# Patient Record
Sex: Female | Born: 1990 | Race: White | Hispanic: Yes | Marital: Single | Smoking: Never smoker
Health system: Southern US, Community
[De-identification: ages and names within clinical notes are randomized; demographics above are authoritative.]

## PROBLEM LIST (undated history)

## (undated) DIAGNOSIS — I10 Essential (primary) hypertension: Secondary | ICD-10-CM

---
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
## 2008-05-09 ENCOUNTER — Ambulatory Visit: Payer: Self-pay | Admitting: Certified Nurse Midwife

## 2009-05-04 ENCOUNTER — Emergency Department: Payer: Self-pay | Admitting: Emergency Medicine

## 2010-07-13 ENCOUNTER — Emergency Department: Payer: Self-pay | Admitting: Unknown Physician Specialty

## 2014-02-05 ENCOUNTER — Emergency Department: Payer: Self-pay | Admitting: Emergency Medicine

## 2014-02-05 LAB — COMPREHENSIVE METABOLIC PANEL
ALBUMIN: 3.5 g/dL (ref 3.4–5.0)
ALK PHOS: 100 U/L
ANION GAP: 6 — AB (ref 7–16)
BUN: 11 mg/dL (ref 7–18)
Bilirubin,Total: 0.3 mg/dL (ref 0.2–1.0)
CHLORIDE: 110 mmol/L — AB (ref 98–107)
Calcium, Total: 8.9 mg/dL (ref 8.5–10.1)
Co2: 22 mmol/L (ref 21–32)
Creatinine: 0.56 mg/dL — ABNORMAL LOW (ref 0.60–1.30)
EGFR (African American): >60
Glucose: 93 mg/dL (ref 65–99)
OSMOLALITY: 275 (ref 275–301)
Potassium: 4.1 mmol/L (ref 3.5–5.1)
SGOT(AST): 23 U/L (ref 15–37)
SGPT (ALT): 30 U/L (ref 12–78)
Sodium: 138 mmol/L (ref 136–145)
Total Protein: 8.1 g/dL (ref 6.4–8.2)

## 2014-02-05 LAB — URINALYSIS, COMPLETE
Bacteria: NONE SEEN
Bilirubin,UR: NEGATIVE
Glucose,UR: NEGATIVE mg/dL (ref 0–75)
Ketone: NEGATIVE
Leukocyte Esterase: NEGATIVE
NITRITE: NEGATIVE
PH: 6 (ref 4.5–8.0)
Protein: NEGATIVE
RBC,UR: 151 /HPF (ref 0–5)
Specific Gravity: 1.018 (ref 1.003–1.030)

## 2014-02-05 LAB — CBC
HCT: 44.9 % (ref 35.0–47.0)
HGB: 14.7 g/dL (ref 12.0–16.0)
MCH: 27.3 pg (ref 26.0–34.0)
MCHC: 32.6 g/dL (ref 32.0–36.0)
MCV: 84 fL (ref 80–100)
PLATELETS: 265 10*3/uL (ref 150–440)
RBC: 5.37 10*6/uL — ABNORMAL HIGH (ref 3.80–5.20)
RDW: 14.7 % — ABNORMAL HIGH (ref 11.5–14.5)
WBC: 10.4 10*3/uL (ref 3.6–11.0)

## 2015-01-22 ENCOUNTER — Emergency Department: Payer: BLUE CROSS/BLUE SHIELD

## 2015-01-22 ENCOUNTER — Encounter: Payer: Self-pay | Admitting: *Deleted

## 2015-01-22 ENCOUNTER — Other Ambulatory Visit: Payer: Self-pay

## 2015-01-22 ENCOUNTER — Emergency Department
Admission: EM | Admit: 2015-01-22 | Discharge: 2015-01-22 | Disposition: A | Discharge status: Home / Self Care | Payer: BLUE CROSS/BLUE SHIELD | Attending: Emergency Medicine | Admitting: Emergency Medicine

## 2015-01-22 DIAGNOSIS — K8050 Calculus of bile duct without cholangitis or cholecystitis without obstruction: Secondary | ICD-10-CM | POA: Insufficient documentation

## 2015-01-22 DIAGNOSIS — R1011 Right upper quadrant pain: Secondary | ICD-10-CM

## 2015-01-22 DIAGNOSIS — Z3202 Encounter for pregnancy test, result negative: Secondary | ICD-10-CM | POA: Insufficient documentation

## 2015-01-22 LAB — COMPREHENSIVE METABOLIC PANEL
ALT: 16 U/L (ref 14–54)
ANION GAP: 8 (ref 5–15)
AST: 18 U/L (ref 15–41)
Albumin: 4 g/dL (ref 3.5–5.0)
Alkaline Phosphatase: 86 U/L (ref 38–126)
BUN: 15 mg/dL (ref 6–20)
CALCIUM: 9.2 mg/dL (ref 8.9–10.3)
CO2: 27 mmol/L (ref 22–32)
CREATININE: 0.78 mg/dL (ref 0.44–1.00)
Chloride: 103 mmol/L (ref 101–111)
Glucose, Bld: 99 mg/dL (ref 65–99)
Potassium: 3.7 mmol/L (ref 3.5–5.1)
Sodium: 138 mmol/L (ref 135–145)
Total Bilirubin: 0.3 mg/dL (ref 0.3–1.2)
Total Protein: 7.8 g/dL (ref 6.5–8.1)

## 2015-01-22 LAB — CBC WITH DIFFERENTIAL/PLATELET
BASOS ABS: 0 10*3/uL (ref 0–0.1)
BASOS PCT: 0 %
EOS ABS: 0.4 10*3/uL (ref 0–0.7)
Eosinophils Relative: 4 %
HEMATOCRIT: 41.9 % (ref 35.0–47.0)
HEMOGLOBIN: 13.9 g/dL (ref 12.0–16.0)
LYMPHS PCT: 37 %
Lymphs Abs: 3.9 10*3/uL — ABNORMAL HIGH (ref 1.0–3.6)
MCH: 28.6 pg (ref 26.0–34.0)
MCHC: 33.2 g/dL (ref 32.0–36.0)
MCV: 86.3 fL (ref 80.0–100.0)
MONOS PCT: 6 %
Monocytes Absolute: 0.7 10*3/uL (ref 0.2–0.9)
NEUTROS ABS: 5.6 10*3/uL (ref 1.4–6.5)
Neutrophils Relative %: 53 %
Platelets: 236 10*3/uL (ref 150–440)
RBC: 4.86 MIL/uL (ref 3.80–5.20)
RDW: 13.5 % (ref 11.5–14.5)
WBC: 10.6 10*3/uL (ref 3.6–11.0)

## 2015-01-22 LAB — URINALYSIS COMPLETE WITH MICROSCOPIC (ARMC ONLY)
Bilirubin Urine: NEGATIVE
Glucose, UA: NEGATIVE mg/dL
Hgb urine dipstick: NEGATIVE
Ketones, ur: NEGATIVE mg/dL
Leukocytes, UA: NEGATIVE
Nitrite: NEGATIVE
PROTEIN: NEGATIVE mg/dL
Specific Gravity, Urine: 1.021 (ref 1.005–1.030)
pH: 7 (ref 5.0–8.0)

## 2015-01-22 LAB — LIPASE, BLOOD: LIPASE: 35 U/L (ref 22–51)

## 2015-01-22 LAB — HCG, QUANTITATIVE, PREGNANCY: HCG, BETA CHAIN, QUANT, S: 0 m[IU]/mL (ref <5)

## 2015-01-22 MED ORDER — ONDANSETRON 4 MG PO TBDP
4.0000 mg | ORAL_TABLET | Freq: Once | ORAL | Status: AC
  Administered 2015-01-22: 4 mg via ORAL

## 2015-01-22 MED ORDER — ONDANSETRON 4 MG PO TBDP
ORAL_TABLET | ORAL | Status: AC
  Administered 2015-01-22: 4 mg via ORAL
  Filled 2015-01-22: qty 1

## 2015-01-22 MED ORDER — ONDANSETRON 8 MG PO TBDP: 8.0000 mg | ORAL_TABLET | Freq: Three times a day (TID) | ORAL | Status: DC | PRN

## 2015-01-22 MED ORDER — ONDANSETRON 8 MG PO TBDP
8.0000 mg | ORAL_TABLET | Freq: Once | ORAL | Status: AC
  Administered 2015-01-22: 8 mg via ORAL

## 2015-01-22 MED ORDER — KETOROLAC TROMETHAMINE 30 MG/ML IJ SOLN: 60.0000 mg | INTRAMUSCULAR | Status: AC

## 2015-01-22 MED ORDER — ONDANSETRON 8 MG PO TBDP
ORAL_TABLET | ORAL | Status: AC
  Administered 2015-01-22: 8 mg via ORAL
  Filled 2015-01-22: qty 1

## 2015-01-22 MED ORDER — KETOROLAC TROMETHAMINE 60 MG/2ML IM SOLN
INTRAMUSCULAR | Status: AC
  Administered 2015-01-22: 60 mg
  Filled 2015-01-22: qty 2

## 2015-01-22 NOTE — ED Notes (Signed)
Pt presents w/ c/o epigastric pain radiating to RUQ and R back starting at 0230 while pt was at work. Pt denies urinary sxs. Pt c/o nausea, denies vomiting and diarrhea. Pt denies prior hx of similar pain.

## 2015-01-22 NOTE — ED Notes (Signed)
Pt to US.

## 2015-01-22 NOTE — ED Provider Notes (Signed)
[facility] Emergency Department Provider Note  ____________________________________________  Time seen: Approximately 7:30 AM  I have reviewed the triage vital signs and the nursing notes.   HISTORY  Chief Complaint Abdominal Pain  HPI Beth Robertson is a 24 y.o. female with no past medical history who presents with achy right upper quadrant abdominal pain starting approximately 1 AM.  Patient describes sudden onset of crampy, achy pain. Patient has a history of prior pregnancies, denies pregnancy today. Patient has a history of prior C-section.  Patient denies fever, vomiting, diarrhea. Has had normal bowel movements. He has been in her normal state of health. She does note having some mild nausea.   History reviewed. No pertinent past medical history.  There are no active problems to display for this patient.   Past Surgical History  Procedure Laterality Date  . Cesarean section      No current outpatient prescriptions on file.  Allergies Percocet  History reviewed. No pertinent family history.  Social History History  Substance Use Topics  . Smoking status: Never Smoker   . Smokeless tobacco: Never Used  . Alcohol Use: Yes     Comment: occasionally    Review of Systems Constitutional: No fever/chills Eyes: No visual changes. ENT: No sore throat. Cardiovascular: Denies chest pain. Respiratory: Denies shortness of breath. Gastrointestinal: See history of present illness  No nausea, no vomiting.  No diarrhea.  No constipation. Genitourinary: Negative for dysuria. No vaginal discharge, no lower abdominal pain, no pelvic pain. Musculoskeletal: Negative for back pain. Skin: Negative for rash. Neurological: Negative for headaches, focal weakness or numbness.  10-point ROS otherwise negative.  ____________________________________________   PHYSICAL EXAM:  VITAL SIGNS: ED Triage Vitals  Enc Vitals Group     BP 01/22/15 0401  127/84 mmHg     Pulse Rate 01/22/15 0401 52     Resp 01/22/15 0401 16     Temp 01/22/15 0401 97.9 F (36.6 C)     Temp Source 01/22/15 0401 Oral     SpO2 01/22/15 0401 99 %     Weight 01/22/15 0401 178 lb (80.74 kg)     Height 01/22/15 0401 5\' 1"  (1.549 m)     Head Cir --      Peak Flow --      Pain Score 01/22/15 0402 9     Pain Loc --      Pain Edu? --      Excl. in GC? --     Constitutional: Alert and oriented. Well appearing and in no acute distress. Eyes: Conjunctivae are normal. PERRL. EOMI. Head: Atraumatic. Nose: No congestion/rhinnorhea. Mouth/Throat: Mucous membranes are moist.  Oropharynx non-erythematous. Neck: No stridor.   Cardiovascular: Normal rate, regular rhythm. Grossly normal heart sounds.  Good peripheral circulation. Respiratory: Normal respiratory effort.  No retractions. Lungs CTAB. Gastrointestinal: Soft and nontender, there is mild tenderness in the right upper quadrant deep palpation, there is no rebound, no guarding. No CVA tenderness. Remainder of the abdominal exam is normal, specifically no right lower quadrant pain, no pain at McBurney's point. No distention. No abdominal bruits. No CVA tenderness. Genitourinary:  Musculoskeletal: No lower extremity tenderness nor edema.  No joint effusions. Neurologic:  Normal speech and language. No gross focal neurologic deficits are appreciated. Speech is normal. No gait instability. Skin:  Skin is warm, dry and intact. No rash noted. Psychiatric: Mood and affect are normal. Speech and behavior are normal.  ____________________________________________   LABS (all labs ordered are listed,  but only abnormal results are displayed)  Labs Reviewed  CBC WITH DIFFERENTIAL/PLATELET - Abnormal; Notable for the following:    Lymphs Abs 3.9 (*)    All other components within normal limits  URINALYSIS COMPLETEWITH MICROSCOPIC ([facility])  - Abnormal; Notable for the following:    Color, Urine YELLOW (*)    APPearance  CLEAR (*)    Bacteria, UA RARE (*)    Squamous Epithelial / LPF 0-5 (*)    All other components within normal limits  COMPREHENSIVE METABOLIC PANEL  LIPASE, BLOOD  PREGNANCY, URINE   ____________________________________________  EKG Sinus bradycardia at a rate of 42, there is right bundle-branch block morphology present. PR interval is 144 QRS 124 QTc 434. This EKG is abnormal, however there is no acute ischemic change. It is notably sinus bradycardia. In this patient who is younger, not complaining of any chest pain or pulmonary symptoms I suspect this may be her baseline and does not explain her tenderness in the right upper quadrant.  ____________________________________________  RADIOLOGY  Ultrasound right upper quadrant.   Multiple all mobile stones measured up to 16 mm diameter. No wall thickening or pericholecystic fluid. Sonographer reports no sonographic Murphy's sign.  Common bile duct:  Diameter: 3.9 mm  Liver:  No focal lesion identified. Within normal limits in parenchymal echogenicity.  IMPRESSION: 1. Cholelithiasis without other ultrasound evidence of cholecystitis or biliary obstruction. ____________________________________________   PROCEDURES  Procedure(s) performed: None  Critical Care performed: No  ____________________________________________   INITIAL IMPRESSION / ASSESSMENT AND PLAN / ED COURSE  Pertinent labs & imaging results that were available during my care of the patient were reviewed by me and considered in my medical decision making (see chart for details).  Right upper quadrant abdominal pain since this morning. Associated nausea, no other associated symptoms. No pelvic or genitourinary symptoms. Patient is not pregnant.  Patient's labs including hepatic function panel are very reassuring. Her exam is reassuring with no rebound guarding or tenderness. She does have some mild right upper quadrant discomfort, but does not appear to  be in distress.  Based on the focality of her pain, we'll obtain an US right upper quadrant to evaluate for biliary colic versus cholecystitis at this time. If negative, anticipate discharge with close return precautions.  ----------------------------------------- 8:48 AM on 01/22/2015 -----------------------------------------  Reevaluation patient reports her pain is much improved. She is resting comfortably, actually sleeping when I initially entered the room. Right upper quadrant has very minimal tenderness, there is no Murphy sign.  Discussed case and care with Dr. Anda KraftMarterre who recommends discharge with outpatient follow-up. I am agreeable with this plan as the position shows no evidence of acute cholecystitis at this time. Normal biliary duct.  I [facility] the [facility], Return precautions including any fever, severe or persistent right upper quadrant pain, vomiting, inability to keep fluids down or other new concerns.  Discharged home condition much improved. Impression biliary colic   ____________________________________________   FINAL CLINICAL IMPRESSION(S) / ED DIAGNOSES  Final diagnoses:  Right upper quadrant abdominal pain      Sharyn CreamerMark Quale, MD 01/22/15 [phone]

## 2015-01-22 NOTE — ED Notes (Signed)
NAD noted at time of D/C. Pt denies questions or concerns. Pt ambulatory to the lobby at this time.  

## 2015-01-22 NOTE — Discharge Instructions (Signed)
Biliary Colic   [facility] on Monday or Tuesday of this week. Please call tomorrow morning to make an appointment.  Biliary colic is a steady or irregular pain in the upper abdomen. It is usually under the right side of the rib cage. It happens when gallstones interfere with the normal flow of bile from the gallbladder. Bile is a liquid that helps to digest fats. Bile is made in the liver and stored in the gallbladder. When you eat a meal, bile passes from the gallbladder through the cystic duct and the common bile duct into the small intestine. There, it mixes with partially digested food. If a gallstone blocks either of these ducts, the normal flow of bile is blocked. The muscle cells in the bile duct contract forcefully to try to move the stone. This causes the pain of biliary colic.  SYMPTOMS   A person with biliary colic usually complains of pain in the upper abdomen. This pain can be:  In the center of the upper abdomen just below the breastbone.  In the upper-right part of the abdomen, near the gallbladder and liver.  Spread back toward the right shoulder blade.  Nausea and vomiting.  The pain usually occurs after eating.  Biliary colic is usually triggered by the digestive system's demand for bile. The demand for bile is high after fatty meals. Symptoms can also occur when a person who has been fasting suddenly eats a very large meal. Most episodes of biliary colic pass after 1 to 5 hours. After the most intense pain passes, your abdomen may continue to ache mildly for about 24 hours. DIAGNOSIS  After you describe your symptoms, your caregiver will perform a physical exam. He or she will pay attention to the upper right portion of your belly (abdomen). This is the area of your liver and gallbladder. An ultrasound will help your caregiver look for gallstones. Specialized scans of the gallbladder may also be done. Blood tests may be done, especially if you have  fever or if your pain persists. PREVENTION  Biliary colic can be prevented by controlling the risk factors for gallstones. Some of these risk factors, such as heredity, increasing age, and pregnancy are a normal part of life. Obesity and a high-fat diet are risk factors you can change through a healthy lifestyle. Women going through menopause who take hormone replacement therapy (estrogen) are also more likely to develop biliary colic. TREATMENT   Pain medication may be prescribed.  You may be encouraged to eat a fat-free diet.  If the first episode of biliary colic is severe, or episodes of colic keep retuning, surgery to remove the gallbladder (cholecystectomy) is usually recommended. This procedure can be done through small incisions using an instrument called a laparoscope. The procedure often requires a brief stay in the hospital. Some people can leave the hospital the same day. It is the most widely used treatment in people troubled by painful gallstones. It is effective and safe, with no complications in more than 90% of cases.  If surgery cannot be done, medication that dissolves gallstones may be used. This medication is expensive and can take months or years to work. Only small stones will dissolve.  Rarely, medication to dissolve gallstones is combined with a procedure called shock-wave lithotripsy. This procedure uses carefully aimed shock waves to break up gallstones. In many people treated with this procedure, gallstones form again within a few years. PROGNOSIS  If gallstones block your cystic duct or  common bile duct, you are at risk for repeated episodes of biliary colic. There is also a 25% chance that you will develop a gallbladder infection(acute cholecystitis), or some other complication of gallstones within 10 to 20 years. If you have surgery, schedule it at a time that is convenient for you and at a time when you are not sick. HOME CARE INSTRUCTIONS   Drink plenty of clear  fluids.  Avoid fatty, greasy or fried foods, or any foods that make your pain worse.  Take medications as directed. SEEK MEDICAL CARE IF:   You develop a fever over 100.5 F (38.1 C).  Your pain gets worse over time.  You develop nausea that prevents you from eating and drinking.  You develop vomiting. SEEK IMMEDIATE MEDICAL CARE IF:   You have continuous or severe belly (abdominal) pain which is not relieved with medications.  You develop nausea and vomiting which is not relieved with medications.  You have symptoms of biliary colic and you suddenly develop a fever and shaking chills. This may signal cholecystitis. Call your caregiver immediately.  You develop a yellow color to your skin or the white part of your eyes (jaundice). Document Released: 02/03/2006 Document Revised: 11/25/2011 Document Reviewed: 04/14/2008 [facility] Patient Information 2015 [employer] [place]. This information is not intended to replace advice given to you by your health care provider. Make sure you discuss any questions you have with your health care provider.

## 2015-02-02 ENCOUNTER — Encounter
Admission: RE | Admit: 2015-02-02 | Discharge: 2015-02-02 | Disposition: A | Discharge status: Home / Self Care | Payer: BLUE CROSS/BLUE SHIELD | Source: Ambulatory Visit | Attending: Surgery | Admitting: Surgery

## 2015-02-02 DIAGNOSIS — Z79899 Other long term (current) drug therapy: Secondary | ICD-10-CM | POA: Insufficient documentation

## 2015-02-02 DIAGNOSIS — K810 Acute cholecystitis: Secondary | ICD-10-CM | POA: Insufficient documentation

## 2015-02-02 HISTORY — DX: Essential (primary) hypertension: I10

## 2015-02-02 NOTE — Patient Instructions (Signed)
  Your procedure is scheduled on: @ADMITDT2 @ 02/10/2015 Report to Day Surgery. To find out your arrival time please call [phone] between 1PM - 3PM on 02/09/15  Remember: Instructions that are not followed completely may result in serious medical risk, up to and including death, or upon the discretion of your surgeon and anesthesiologist your surgery may need to be rescheduled.    ___x_ 1. Do not eat food or drink liquids after midnight. No gum chewing or hard candies.     ___x_ 2. No Alcohol for 24 hours before or after surgery.   ____ 3. Bring all medications with you on the day of surgery if instructed.    ___x_ 4. Notify your doctor if there is any change in your medical condition     (cold, fever, infections).     Do not wear jewelry, make-up, hairpins, clips or nail polish.  Do not wear lotions, powders, or perfumes. You may wear deodorant.  Do not shave 48 hours prior to surgery. Men may shave face and neck.  Do not bring valuables to the hospital.    [facility] is not responsible for any belongings or valuables.               Contacts, dentures or bridgework may not be worn into surgery.  Leave your suitcase in the car. After surgery it may be brought to your room.  For patients admitted to the hospital, discharge time is determined by your                treatment team.   Patients discharged the day of surgery will not be allowed to drive home.   Please read over the following fact sheets that you were given:   Surgical Site Infection Prevention   ____ Take these medicines the morning of surgery with A SIP OF WATER:    1.   2.   3.   4.  5.  6.  ____ Fleet Enema (as directed)   __x__ Use CHG Soap as directed  ____ Use inhalers on the day of surgery  ____ Stop metformin 2 days prior to surgery    ____ Take 1/2 of usual insulin dose the night before surgery and none on the morning of surgery.   ____ Stop Coumadin/Plavix/aspirin on   ___x_ Stop  Anti-inflammatories on ibuprofen   ____ Stop supplements until after surgery.    ____ Bring C-Pap to the hospital.

## 2015-02-02 NOTE — OR Nursing (Signed)
Patient would prefer her instructions to be written in Spanish.  She does not need an interpreter for verbal instructions.

## 2015-02-10 ENCOUNTER — Encounter
Admission: RE | Disposition: A | Discharge status: Home / Self Care | Payer: Self-pay | Source: Ambulatory Visit | Attending: Surgery

## 2015-02-10 ENCOUNTER — Ambulatory Visit: Payer: BLUE CROSS/BLUE SHIELD | Admitting: *Deleted

## 2015-02-10 ENCOUNTER — Ambulatory Visit
Admission: RE | Admit: 2015-02-10 | Discharge: 2015-02-10 | Disposition: A | Discharge status: Home / Self Care | Payer: BLUE CROSS/BLUE SHIELD | Source: Ambulatory Visit | Attending: Surgery | Admitting: Surgery

## 2015-02-10 ENCOUNTER — Encounter: Payer: Self-pay | Admitting: *Deleted

## 2015-02-10 DIAGNOSIS — Z791 Long term (current) use of non-steroidal anti-inflammatories (NSAID): Secondary | ICD-10-CM | POA: Diagnosis not present

## 2015-02-10 DIAGNOSIS — Z885 Allergy status to narcotic agent status: Secondary | ICD-10-CM | POA: Diagnosis not present

## 2015-02-10 DIAGNOSIS — K812 Acute cholecystitis with chronic cholecystitis: Secondary | ICD-10-CM | POA: Insufficient documentation

## 2015-02-10 DIAGNOSIS — Z79899 Other long term (current) drug therapy: Secondary | ICD-10-CM | POA: Insufficient documentation

## 2015-02-10 HISTORY — PX: ROBOTIC ASSISTED LAPAROSCOPIC CHOLECYSTECTOMY-MULTI SITE: SHX6603

## 2015-02-10 LAB — POCT PREGNANCY, URINE: Preg Test, Ur: NEGATIVE

## 2015-02-10 SURGERY — ROBOTIC ASSISTED LAPAROSCOPIC CHOLECYSTECTOMY-MULTI SITE
Anesthesia: General | Wound class: Clean Contaminated

## 2015-02-10 MED ORDER — KETOROLAC TROMETHAMINE 30 MG/ML IJ SOLN
INTRAMUSCULAR | Status: DC | PRN
  Administered 2015-02-10: 30 mg via INTRAVENOUS

## 2015-02-10 MED ORDER — ROCURONIUM BROMIDE 100 MG/10ML IV SOLN
INTRAVENOUS | Status: DC | PRN
  Administered 2015-02-10: 40 mg via INTRAVENOUS
  Administered 2015-02-10: 10 mg via INTRAVENOUS

## 2015-02-10 MED ORDER — FAMOTIDINE 20 MG PO TABS
20.0000 mg | ORAL_TABLET | Freq: Once | ORAL | Status: AC
  Administered 2015-02-10: 09:00:00 via ORAL

## 2015-02-10 MED ORDER — MIDAZOLAM HCL 5 MG/5ML IJ SOLN
INTRAMUSCULAR | Status: DC | PRN
  Administered 2015-02-10: 2 mg via INTRAVENOUS

## 2015-02-10 MED ORDER — ACETAMINOPHEN 10 MG/ML IV SOLN
INTRAVENOUS | Status: DC | PRN
  Administered 2015-02-10: 1000 mg via INTRAVENOUS

## 2015-02-10 MED ORDER — GLYCOPYRROLATE 0.2 MG/ML IJ SOLN
INTRAMUSCULAR | Status: DC | PRN
  Administered 2015-02-10: 0.6 mg via INTRAVENOUS

## 2015-02-10 MED ORDER — TRAMADOL HCL 50 MG PO TABS
50.0000 mg | ORAL_TABLET | Freq: Four times a day (QID) | ORAL | Status: DC | PRN
  Administered 2015-02-10: 50 mg via ORAL
  Filled 2015-02-10: qty 1

## 2015-02-10 MED ORDER — FENTANYL CITRATE (PF) 100 MCG/2ML IJ SOLN
INTRAMUSCULAR | Status: AC
  Administered 2015-02-10: 25 ug via INTRAVENOUS
  Filled 2015-02-10: qty 2

## 2015-02-10 MED ORDER — NEOSTIGMINE METHYLSULFATE 10 MG/10ML IV SOLN
INTRAVENOUS | Status: DC | PRN
  Administered 2015-02-10: 3 mg via INTRAVENOUS

## 2015-02-10 MED ORDER — KETOROLAC TROMETHAMINE 30 MG/ML IJ SOLN
30.0000 mg | Freq: Once | INTRAMUSCULAR | Status: DC | PRN
  Filled 2015-02-10: qty 1

## 2015-02-10 MED ORDER — HEPARIN SODIUM (PORCINE) 5000 UNIT/ML IJ SOLN
5000.0000 [IU] | Freq: Once | INTRAMUSCULAR | Status: AC
  Administered 2015-02-10: 5000 [IU] via SUBCUTANEOUS

## 2015-02-10 MED ORDER — FENTANYL CITRATE (PF) 100 MCG/2ML IJ SOLN
INTRAMUSCULAR | Status: DC | PRN
  Administered 2015-02-10 (×2): 50 ug via INTRAVENOUS
  Administered 2015-02-10: 100 ug via INTRAVENOUS

## 2015-02-10 MED ORDER — HEPARIN SODIUM (PORCINE) 5000 UNIT/ML IJ SOLN
INTRAMUSCULAR | Status: AC
  Administered 2015-02-10: 5000 [IU] via SUBCUTANEOUS
  Filled 2015-02-10: qty 1

## 2015-02-10 MED ORDER — LIDOCAINE HCL (CARDIAC) 20 MG/ML IV SOLN
INTRAVENOUS | Status: DC | PRN
  Administered 2015-02-10: 50 mg via INTRAVENOUS

## 2015-02-10 MED ORDER — BUPIVACAINE HCL 0.25 % IJ SOLN
INTRAMUSCULAR | Status: DC | PRN
  Administered 2015-02-10: 20 mL

## 2015-02-10 MED ORDER — TRAMADOL HCL 50 MG PO TABS
ORAL_TABLET | ORAL | Status: AC
  Filled 2015-02-10: qty 1

## 2015-02-10 MED ORDER — PROPOFOL 10 MG/ML IV BOLUS
INTRAVENOUS | Status: DC | PRN
  Administered 2015-02-10: 150 mg via INTRAVENOUS

## 2015-02-10 MED ORDER — DEXAMETHASONE SODIUM PHOSPHATE 10 MG/ML IJ SOLN
INTRAMUSCULAR | Status: DC | PRN
  Administered 2015-02-10: 8 mg via INTRAVENOUS

## 2015-02-10 MED ORDER — ONDANSETRON HCL 4 MG/2ML IJ SOLN
INTRAMUSCULAR | Status: DC | PRN
  Administered 2015-02-10: 4 mg via INTRAVENOUS

## 2015-02-10 MED ORDER — TRAMADOL HCL 50 MG PO TABS: 50.0000 mg | ORAL_TABLET | Freq: Four times a day (QID) | ORAL | Status: DC | PRN

## 2015-02-10 MED ORDER — ONDANSETRON HCL 4 MG/2ML IJ SOLN
4.0000 mg | Freq: Once | INTRAMUSCULAR | Status: AC | PRN
  Administered 2015-02-10: 4 mg via INTRAVENOUS

## 2015-02-10 MED ORDER — ACETAMINOPHEN 10 MG/ML IV SOLN
INTRAVENOUS | Status: AC
  Filled 2015-02-10: qty 100

## 2015-02-10 MED ORDER — FENTANYL CITRATE (PF) 100 MCG/2ML IJ SOLN
25.0000 ug | INTRAMUSCULAR | Status: DC | PRN
  Administered 2015-02-10 (×4): 25 ug via INTRAVENOUS

## 2015-02-10 MED ORDER — FAMOTIDINE 20 MG PO TABS
ORAL_TABLET | ORAL | Status: AC
Start: 2015-02-10 — End: 2015-02-10
  Filled 2015-02-10: qty 1

## 2015-02-10 MED ORDER — LACTATED RINGERS IV SOLN
INTRAVENOUS | Status: DC
  Administered 2015-02-10 (×2): via INTRAVENOUS

## 2015-02-10 MED ORDER — ONDANSETRON HCL 4 MG/2ML IJ SOLN
INTRAMUSCULAR | Status: AC
  Filled 2015-02-10: qty 2

## 2015-02-10 MED ORDER — BUPIVACAINE HCL (PF) 0.25 % IJ SOLN
INTRAMUSCULAR | Status: AC
  Filled 2015-02-10: qty 30

## 2015-02-10 MED ORDER — DEXTROSE 5 % IV SOLN
1.0000 g | Freq: Once | INTRAVENOUS | Status: AC
  Administered 2015-02-10: 1 g via INTRAVENOUS
  Filled 2015-02-10: qty 1

## 2015-02-10 SURGICAL SUPPLY — 60 items
APPLIER CLIP 55.6 M/L HEMOLOK (INSTRUMENTS) ×3 IMPLANT
APPLIER CLIP ROT 10 11.4 M/L (STAPLE)
CANISTER SUCT 1200ML W/VALVE (MISCELLANEOUS) ×3 IMPLANT
CANNULA SEAL 5MM (CANNULA) ×2
CHLORAPREP W/TINT 26ML (MISCELLANEOUS) ×3 IMPLANT
CLIP APPLIE ROT 10 11.4 M/L (STAPLE) IMPLANT
CLIP LIGATING HEMO O LOK GREEN (MISCELLANEOUS) ×3 IMPLANT
CORD BIP STRL DISP 12FT (MISCELLANEOUS) ×3 IMPLANT
COVER MAYO STAND STRL (DRAPES) ×3 IMPLANT
COVER TIP SHEARS 8 DVNC (MISCELLANEOUS) ×1 IMPLANT
COVER TIP SHEARS 8MM DA VINCI (MISCELLANEOUS) ×2
DEFOGGER SCOPE WARMER CLEARIFY (MISCELLANEOUS) ×3 IMPLANT
DRAPE SHEET LG 3/4 BI-LAMINATE (DRAPES) ×6 IMPLANT
DRAPE UTILITY 15X26 TOWEL STRL (DRAPES) ×3 IMPLANT
DRSG TEGADERM 2-3/8X2-3/4 SM (GAUZE/BANDAGES/DRESSINGS) ×3 IMPLANT
DRSG TELFA 3X8 NADH (GAUZE/BANDAGES/DRESSINGS) ×3 IMPLANT
ENDOPOUCH RETRIEVER 10 (MISCELLANEOUS) ×6 IMPLANT
GLOVE BIO SURGEON STRL SZ7.5 (GLOVE) ×12 IMPLANT
GLOVE INDICATOR 8.0 STRL GRN (GLOVE) ×6 IMPLANT
GOWN STRL REUS W/ TWL LRG LVL3 (GOWN DISPOSABLE) ×2 IMPLANT
GOWN STRL REUS W/ TWL XL LVL3 (GOWN DISPOSABLE) ×2 IMPLANT
GOWN STRL REUS W/TWL LRG LVL3 (GOWN DISPOSABLE) ×4
GOWN STRL REUS W/TWL XL LVL3 (GOWN DISPOSABLE) ×4
GRASPER SUT TROCAR 14GX15 (MISCELLANEOUS) ×3 IMPLANT
IRRIGATION STRYKERFLOW (MISCELLANEOUS) ×1 IMPLANT
IRRIGATOR STRYKERFLOW (MISCELLANEOUS) ×3
IV NS 1000ML (IV SOLUTION) ×2
IV NS 1000ML BAXH (IV SOLUTION) ×1 IMPLANT
KIT ACCESSORY DA VINCI DISP (KITS) ×2
KIT ACCESSORY DVNC DISP (KITS) ×1 IMPLANT
KIT RM TURNOVER STRD PROC AR (KITS) ×3 IMPLANT
LABEL OR SOLS (LABEL) ×3 IMPLANT
NDL SAFETY 18GX1.5 (NEEDLE) ×3 IMPLANT
NDL SAFETY 25GX1.5 (NEEDLE) IMPLANT
NS IRRIG 500ML POUR BTL (IV SOLUTION) ×3 IMPLANT
PACK LAP CHOLECYSTECTOMY (MISCELLANEOUS) ×3 IMPLANT
PAD GROUND ADULT SPLIT (MISCELLANEOUS) ×3 IMPLANT
PAD TRENDELENBURG OR TABLE (MISCELLANEOUS) ×3 IMPLANT
PORT ENDOSCOPE SS 8.5MM (MISCELLANEOUS) ×3 IMPLANT
PROGRASP ENDOWRIST DA VINCI (INSTRUMENTS) ×2
PROGRASP ENDOWRIST DVNC (INSTRUMENTS) ×1 IMPLANT
SCISSORS METZENBAUM CVD 33 (INSTRUMENTS) ×3 IMPLANT
SEAL CANN 5 DVNC (CANNULA) ×1 IMPLANT
SET CHOLANGIOGRAPHY PERC (MISCELLANEOUS) ×3 IMPLANT
SLEEVE ADV FIXATION 5X100MM (TROCAR) ×3 IMPLANT
SOLUTION ELECTROLUBE (MISCELLANEOUS) ×3 IMPLANT
SUT VIC AB 0 CT2 27 (SUTURE) ×3 IMPLANT
SUT VIC AB 0 UR5 27 (SUTURE) ×6 IMPLANT
SUT VIC AB 3-0 SH 27 (SUTURE) ×2
SUT VIC AB 3-0 SH 27X BRD (SUTURE) ×1 IMPLANT
SUT VIC AB 4-0 SH 27 (SUTURE) ×2
SUT VIC AB 4-0 SH 27XANBCTRL (SUTURE) ×1 IMPLANT
SYR 3ML LL SCALE MARK (SYRINGE) ×3 IMPLANT
TROCAR 12M 150ML BLUNT (TROCAR) IMPLANT
TROCAR 130MM GELPORT  DAV (MISCELLANEOUS) ×3 IMPLANT
TROCAR BLUNT TIP 12MM OMST12BT (TROCAR) IMPLANT
TROCAR Z-THREAD FIOS 11X100 BL (TROCAR) IMPLANT
TROCAR Z-THREAD OPTICAL 5X100M (TROCAR) IMPLANT
TROCAR Z-THREAD SLEEVE 11X100 (TROCAR) IMPLANT
TUBING INSUFFLATOR HI FLOW (MISCELLANEOUS) ×3 IMPLANT

## 2015-02-10 NOTE — Progress Notes (Signed)
preop report given to Cleotilde NeerSara Olejar, RN by Nicholes RoughSusan Zyron Deeley, RN

## 2015-02-10 NOTE — Op Note (Signed)
02/10/2015  1:29 PM  PATIENT:  Beth Robertson  24 y.o. female  PRE-OPERATIVE DIAGNOSIS:  ACUTE CHOLECYSTITIS, chronic cholecystitis, cholelithiasis.  POST-OPERATIVE DIAGNOSIS:  same  PROCEDURE:  Procedure(s): ROBOTIC ASSISTED LAPAROSCOPIC CHOLECYSTECTOMY-SINGLE SITE  (N/A)  SURGEON:  Surgeon(s) and Role:    * Natale LayMark Keric Zehren, MD - Primary  ASSISTANTS: scrub tech times 2.   ANESTHESIA:   General  SPECIMEN:  gallbladder  Stable to PACU

## 2015-02-10 NOTE — Op Note (Signed)
02/10/2015  1:30 PM  PATIENT:  Beth Robertson  24 y.o. female  PRE-OPERATIVE DIAGNOSIS:  ACUTE AND CHRONIC CHOLECYSTITIS  POST-OPERATIVE DIAGNOSIS:  same  PROCEDURE:  Procedure(s): ROBOTIC ASSISTED LAPAROSCOPIC CHOLECYSTECTOMY-SINGLE SITE  (N/A)  SURGEON:  Surgeon(s) and Role:    Natale LayMark Alver Leete, MD - Primary  ANESTHESIA:   Gen.  SPECIMEN:  Gallbladder and contents  Findings stones  Estimated blood loss minimal  Description of procedure:   With informed consent supine position general endotracheal anesthesia was induced patient's left arm was padded and tucked at her side. Table was then rotated 90. Her abdomen was widely prepped and draped utilizing chlor prep solution timeout was observed.  A transversely oriented infraumbilical skin incision was fashion with scalpel and carried down with electrocautery to the fascia.   Fascia was incised in a vertical orientation measuring approximately 2 cm.  Stay sutures of 000 Vicryl were placed on either side.  Pneumoperitoneum was established through the single site GelPort device. Camera port was placed and generalized inspection of the right upper quadrant demonstrated no adhesions within the right upper quadrant.   The case was menable to a single site approach. Camera and patient cart was docked. Robotic arms were then inserted under direct visualization in the standard protocol for single site surgery. Assistant port was placed. Bariatric grasper was used to grab the fundus and elevated towards the right lobe of the liver. I then moved to the console.  Lateral traction was applied to Hartman's pouch. Dissection of the hepatoduodenal ligament liberated a single cystic duct and single cystic artery and a critical view of safety cholecystectomy was achieved. The cystic duct was singly clipped with a locking medium Hemoclip on either side and divided with sharp scissors. Cystic artery was doubly ligated on the portal side with locking Hemoclip  and divided. Further dissection demonstrated no evidence of an aberrant bile duct posterior artery. The gallbladder was retrieved up the gallbladder fossa with spillage of one stone which was later retrieved extracted. Specimen was captured in an Endo Catch device and retrieved. The right upper quadrant was irrigated of spilled bile with 500 cc of warm normal saline and aspirated dry and hemostasis appeared be excellent on the operative field.  The GelPort was removed. Pneumoperitoneum was released. A total of 30 cc of 0.25% plain Marcaine was infiltrated in the fascial incision. Fascial incision was then reapproximated in vertical orientation with several interrupted simple and figure-of-eight suture of #000 Vicryl.  Skin edges were approximated utilizing 4-0 rectal septic catheter along with deep 3-0 Vicryls in the subcutaneous layer.

## 2015-02-10 NOTE — Progress Notes (Signed)
Bair paws applied preop - Robotic Assisted case confirmed with Tacey RuizLeah, OR desk

## 2015-02-10 NOTE — Transfer of Care (Signed)
Immediate Anesthesia Transfer of Care Note  Patient: Beth Robertson  Procedure(s) Performed: Procedure(s): ROBOTIC ASSISTED LAPAROSCOPIC CHOLECYSTECTOMY-SINGLE SITE  (N/A)  Patient Location: PACU  Anesthesia Type:General  Level of Consciousness: awake and sedated  Airway & Oxygen Therapy: Patient Spontanous Breathing and Patient connected to face mask oxygen  Post-op Assessment: Report given to RN  Post vital signs: Reviewed and stable  Last Vitals:  Filed Vitals:   02/10/15 0855  BP: 115/61  Pulse: 54  Temp: 36.6 C  Resp: 16    Complications: No apparent anesthesia complications

## 2015-02-10 NOTE — Discharge Instructions (Signed)
AMBULATORY SURGERY  DISCHARGE INSTRUCTIONS   1) The drugs that you were given will stay in your system until tomorrow so for the next 24 hours you should not:  A) Drive an automobile B) Make any legal decisions C) Drink any alcoholic beverage   2) You may resume regular meals tomorrow.  Today it is better to start with liquids and gradually work up to solid foods.  You may eat anything you prefer, but it is better to start with liquids, then soup and crackers, and gradually work up to solid foods.   3) Please notify your doctor immediately if you have any unusual bleeding, trouble breathing, redness and pain at the surgery site, drainage, fever, or pain not relieved by medication. 4)   .   5) Additional Instructions: Hospital Main Number: [phone]

## 2015-02-10 NOTE — Anesthesia Preprocedure Evaluation (Signed)
Anesthesia Evaluation  Patient identified by MRN, date of birth, ID band Patient awake    Reviewed: Allergy & Precautions, NPO status , Patient's Chart, lab work & pertinent test results  Airway Mallampati: I  TM Distance: >3 FB Neck ROM: Full    Dental  (+) Teeth Intact   Pulmonary    Pulmonary exam normal       Cardiovascular Exercise Tolerance: Good hypertension, Normal cardiovascular examRhythm:Regular Rate:Normal     Neuro/Psych    GI/Hepatic   Endo/Other    Renal/GU      Musculoskeletal   Abdominal (+)  Abdomen: soft.    Peds  Hematology   Anesthesia Other Findings   Reproductive/Obstetrics                             Anesthesia Physical Anesthesia Plan  ASA: II  Anesthesia Plan: General   Post-op Pain Management:    Induction: Intravenous  Airway Management Planned: Oral ETT  Additional Equipment:   Intra-op Plan:   Post-operative Plan: Extubation in OR  Informed Consent: I have reviewed the patients History and Physical, chart, labs and discussed the procedure including the risks, benefits and alternatives for the proposed anesthesia with the patient or authorized representative who has indicated his/her understanding and acceptance.     Plan Discussed with: CRNA  Anesthesia Plan Comments:         Anesthesia Quick Evaluation

## 2015-02-10 NOTE — Anesthesia Postprocedure Evaluation (Signed)
  Anesthesia Post-op Note  Patient: Beth Robertson  Procedure(s) Performed: Procedure(s): ROBOTIC ASSISTED LAPAROSCOPIC CHOLECYSTECTOMY-SINGLE SITE  (N/A)  Anesthesia type:General  Patient location: PACU  Post pain: Pain level controlled  Post assessment: Post-op Vital signs reviewed, Patient's Cardiovascular Status Stable, Respiratory Function Stable, Patent Airway and No signs of Nausea or vomiting  Post vital signs: Reviewed and stable  Last Vitals:  Filed Vitals:   02/10/15 1419  BP:   Pulse: 51  Temp:   Resp: 13    Level of consciousness: awake, alert  and patient cooperative  Complications: No apparent anesthesia complications

## 2015-02-10 NOTE — Progress Notes (Signed)
Pt fluent in [place] - will need interpreter for postop only for spouse

## 2015-02-10 NOTE — Anesthesia Procedure Notes (Signed)
Procedure Name: Intubation Date/Time: 02/10/2015 11:25 AM Performed by: Darcus AustinFERDINANDSEN, Beth Arrambide Pre-anesthesia Checklist: Patient identified, Emergency Drugs available, Suction available, Patient being monitored and Timeout performed Patient Re-evaluated:Patient Re-evaluated prior to inductionOxygen Delivery Method: Circle system utilized Preoxygenation: Pre-oxygenation with 100% oxygen Intubation Type: IV induction Ventilation: Mask ventilation without difficulty Laryngoscope Size: 3 Grade View: Grade I Tube type: Oral Tube size: 7.0 mm Number of attempts: 1 Airway Equipment and Method: Stylet Placement Confirmation: ETT inserted through vocal cords under direct vision,  positive ETCO2 and breath sounds checked- equal and bilateral Secured at: 18 cm Tube secured with: Tape Dental Injury: Teeth and Oropharynx as per pre-operative assessment  Comments: Gauze bite block

## 2015-02-10 NOTE — H&P (Signed)
Date of Initial H&P: 01/25/2015  History reviewed, patient examined, no change in status, stable for surgery. 

## 2015-02-13 ENCOUNTER — Encounter: Payer: Self-pay | Admitting: Surgery

## 2015-02-14 LAB — SURGICAL PATHOLOGY

## 2015-02-16 ENCOUNTER — Ambulatory Visit: Payer: Self-pay | Admitting: Surgery

## 2015-02-16 ENCOUNTER — Encounter: Payer: Self-pay | Admitting: Surgery

## 2015-02-16 ENCOUNTER — Ambulatory Visit (INDEPENDENT_AMBULATORY_CARE_PROVIDER_SITE_OTHER): Payer: BLUE CROSS/BLUE SHIELD | Admitting: Surgery

## 2015-02-16 VITALS — BP 117/71 | HR 55 | Temp 98.4°F | Resp 20

## 2015-02-16 DIAGNOSIS — K810 Acute cholecystitis: Secondary | ICD-10-CM

## 2015-02-16 NOTE — Patient Instructions (Signed)
Follow-up with Dr. Egbert GaribaldiBird in 1 week

## 2015-02-16 NOTE — Progress Notes (Signed)
Outpatient postop visit  02/16/2015  Beth Robertson is an 24 y.o. female.    Procedure: Robotic cholecystectomy  CC:  HPI: Status post robotic cholecystectomy. She has no problems still taking some narcotics however no nausea or vomiting.  Medications reviewed.    Physical Exam:  BP 117/71 mmHg  Pulse 55  Temp(Src) 98.4 F (36.9 C) (Oral)  Resp 20  LMP 09/03/2014 (Approximate)    PE: Obese female. No icterus no jaundice. Abdomen soft nontender. Single periumbilical wound healing well. No erythema and no drainage    Assessment/Plan:  Doing well postop recommend follow-up with Dr. Egbert GaribaldiBird next week. No lifting for 2 more weeks.  Lattie Hawichard E Cooper, MD, FACS

## 2015-02-17 ENCOUNTER — Ambulatory Visit: Payer: Self-pay | Admitting: Surgery

## 2015-03-07 ENCOUNTER — Telehealth: Payer: Self-pay | Admitting: Surgery

## 2015-03-07 ENCOUNTER — Encounter: Payer: Self-pay | Admitting: Surgery

## 2015-03-07 ENCOUNTER — Ambulatory Visit (INDEPENDENT_AMBULATORY_CARE_PROVIDER_SITE_OTHER): Payer: BLUE CROSS/BLUE SHIELD | Admitting: Surgery

## 2015-03-07 VITALS — BP 124/89 | HR 64 | Temp 98.7°F | Ht 61.0 in | Wt 175.0 lb

## 2015-03-07 DIAGNOSIS — K810 Acute cholecystitis: Secondary | ICD-10-CM

## 2015-03-07 NOTE — Telephone Encounter (Signed)
Pt has questions about limitations for exercising. Please contact pt.

## 2015-03-07 NOTE — Patient Instructions (Signed)
Follow-up as needed.   Please call our office with questions or concerns.

## 2015-03-10 NOTE — Telephone Encounter (Signed)
Returned patient call. Informed patient, as per Dr Earmon Phoenix last office note, that she can return slowly back to normal activities at this time. Patient confirmed understanding of directions.

## 2015-03-14 DIAGNOSIS — K810 Acute cholecystitis: Secondary | ICD-10-CM | POA: Insufficient documentation

## 2015-03-14 NOTE — Progress Notes (Signed)
The patient returns to the office today status post single site robotically assisted laparoscopic cholecystectomy. Prior incisional redness has resolved. She is doing well. Vital signs remain stable. Abdomen is soft and nontender with incision which is healed nicely. Impression status post single site laparoscopic robotically-assisted cholecystectomy. Overall she is doing very well. I will see her back in the office as needed. Activity and diet as tolerated.

## 2016-07-31 IMAGING — US US ABDOMEN LIMITED
1 series · 14 of 25 positions shown · non-contrast
Comparison: None.

CLINICAL DATA: Abdominal pain x6 hours

EXAM:
US ABDOMEN LIMITED - RIGHT UPPER QUADRANT

[Series 1: us abdomen limited · 0.22mm/px · 14 of 100 slices shown]
[im 1/100]
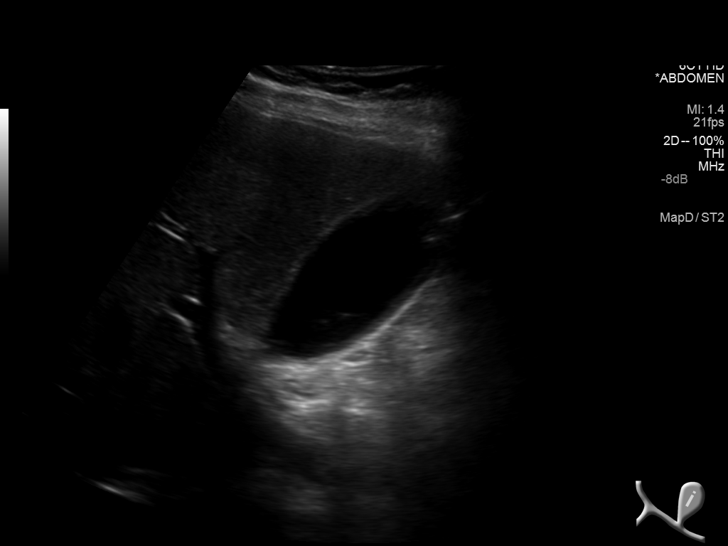
[im 9/100]
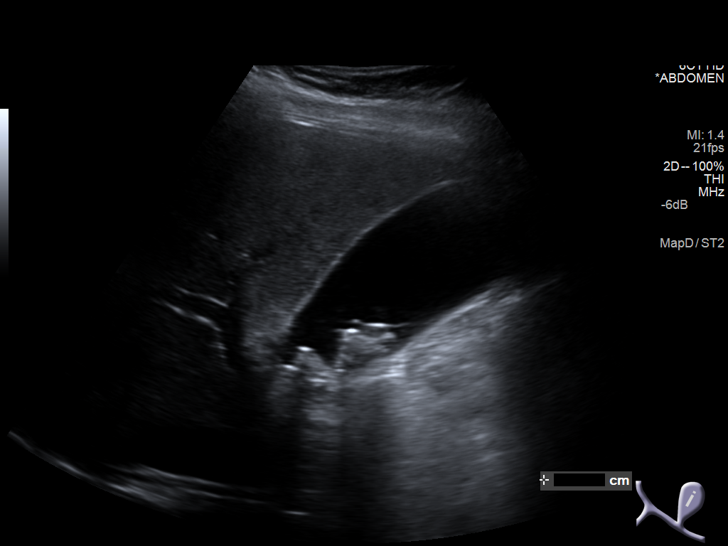
[im 17/100]
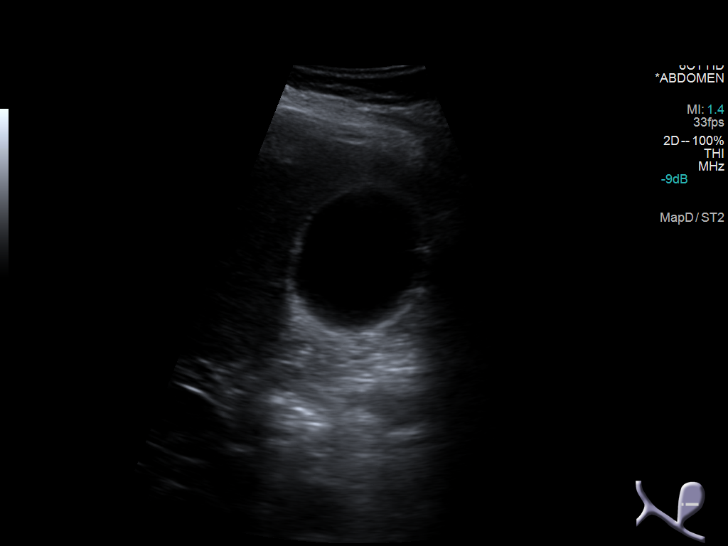
[im 25/100]
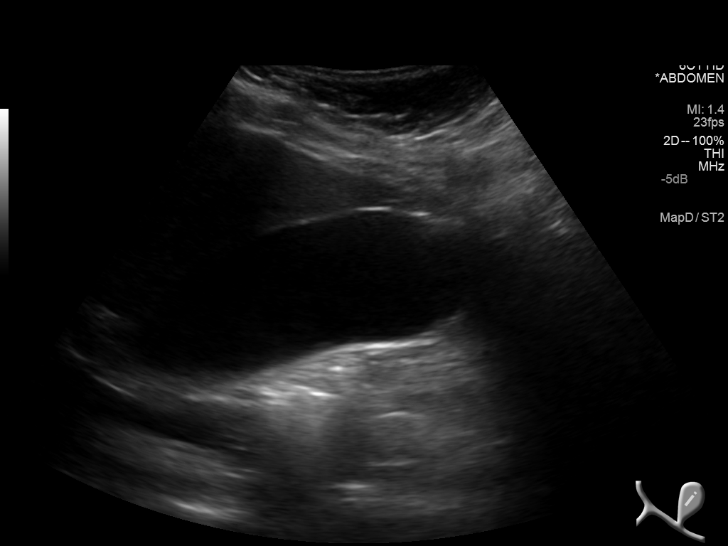
[im 34/100]
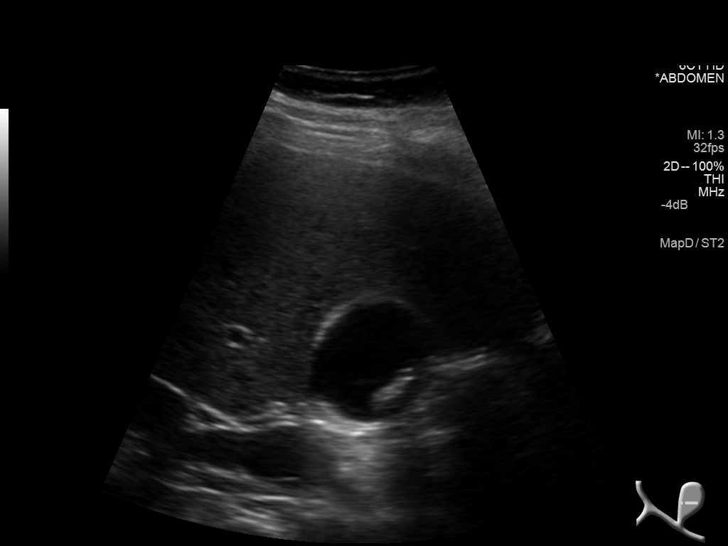
[im 38/100]
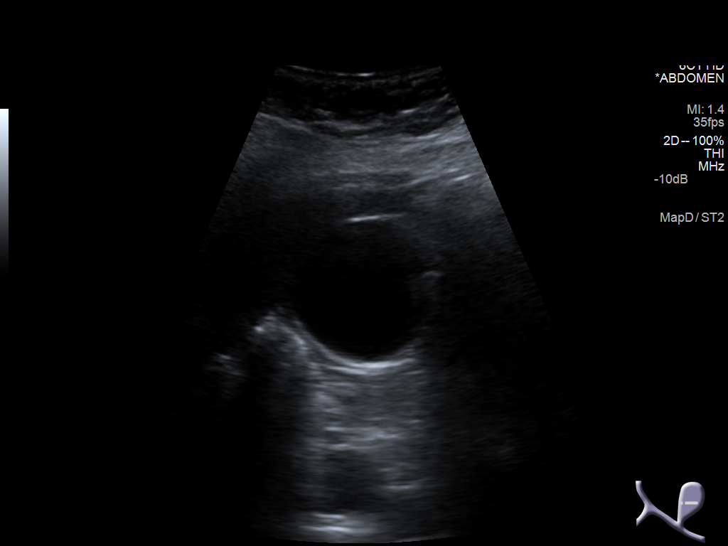
[im 46/100]
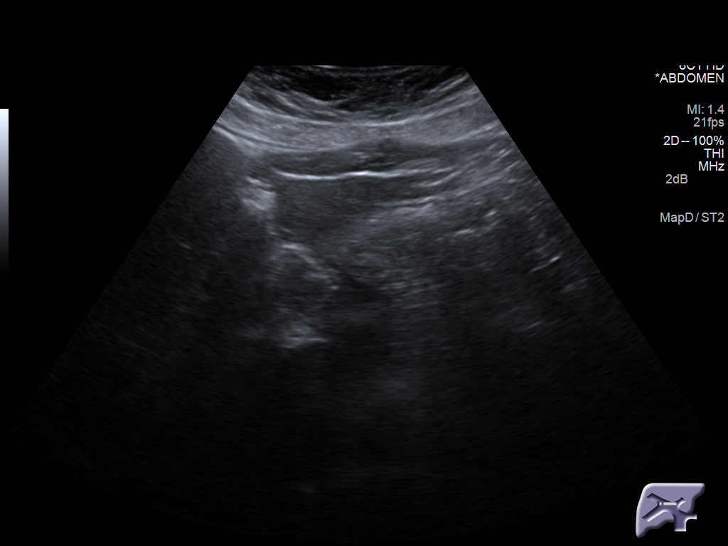
[im 54/100]
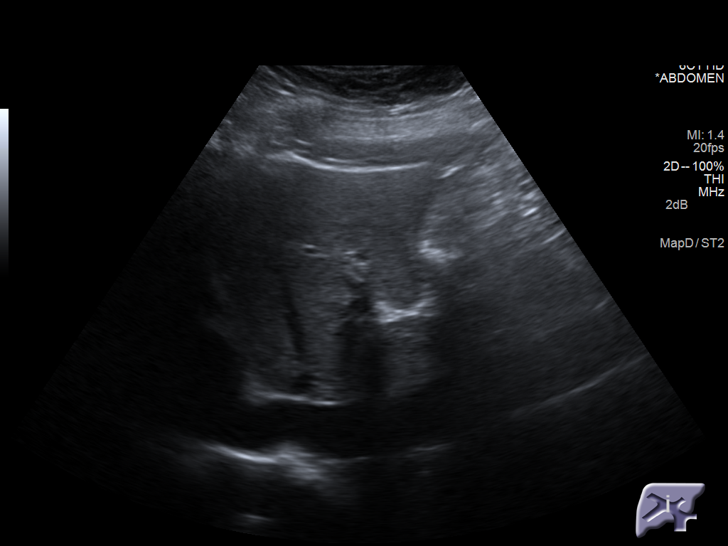
[im 62/100]
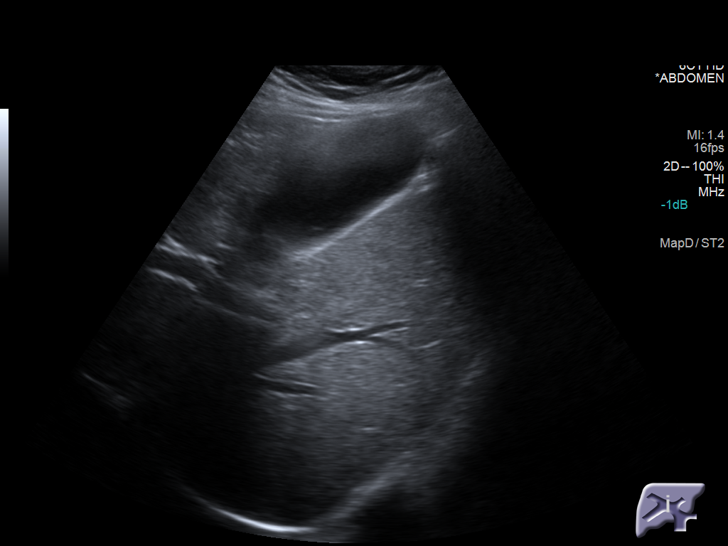
[im 67/100]
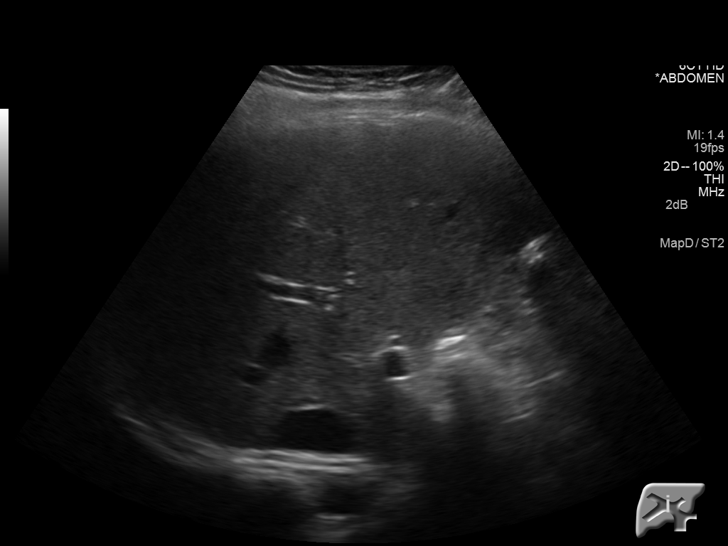
[im 75/100]
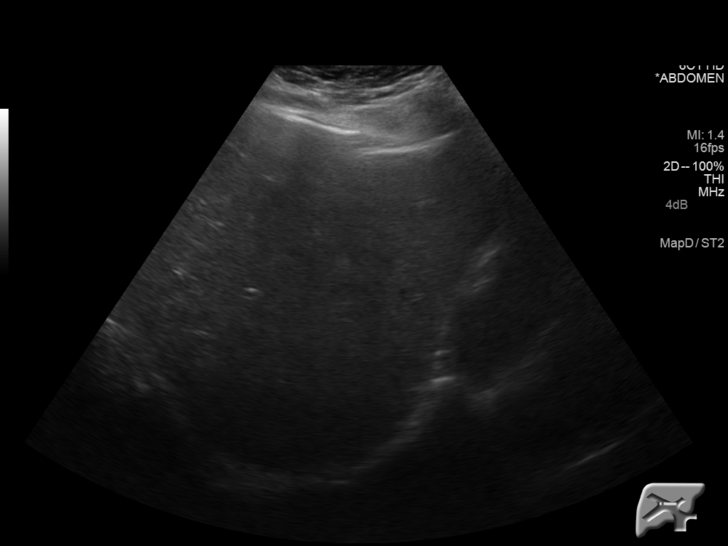
[im 83/100]
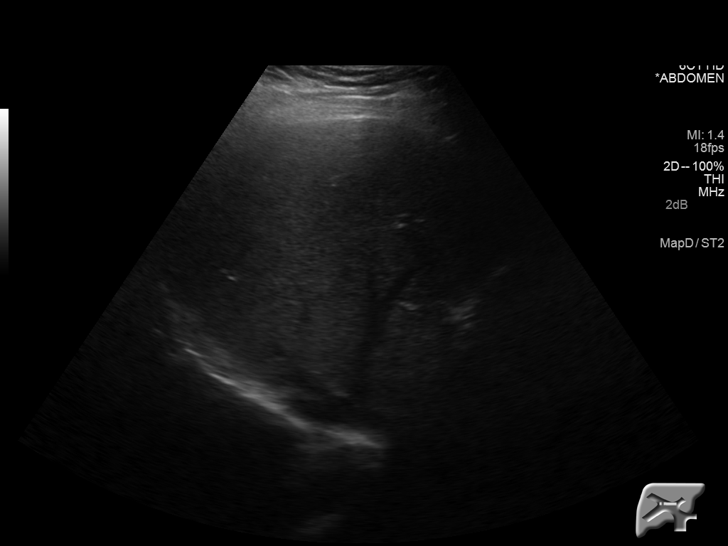
[im 91/100]
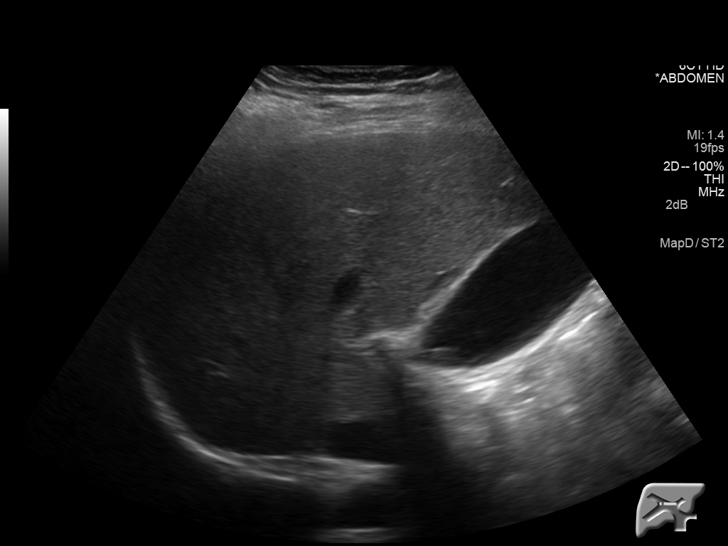
[im 100/100]
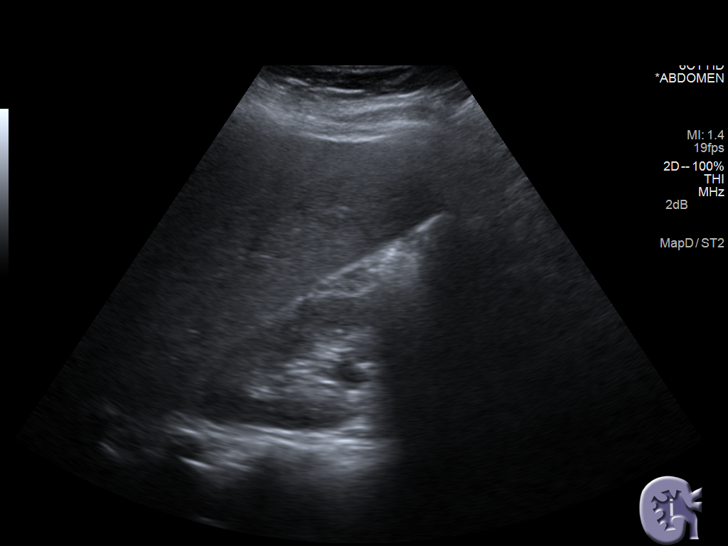

[14 of 25 positions shown; findings below may reference images not displayed]

FINDINGS: Gallbladder:

Multiple all mobile stones measured up to 16 mm diameter. No wall
thickening or pericholecystic fluid. Sonographer reports no
sonographic Murphy's sign.

Common bile duct:

Diameter: 3.9 mm

Liver:

No focal lesion identified. Within normal limits in parenchymal
echogenicity.
IMPRESSION: 1. Cholelithiasis without other ultrasound evidence of cholecystitis
or biliary obstruction.

## 2019-09-25 ENCOUNTER — Emergency Department
Admission: EM | Admit: 2019-09-25 | Discharge: 2019-09-25 | Disposition: A | Discharge status: Home / Self Care | Payer: Self-pay | Attending: Student | Admitting: Student

## 2019-09-25 ENCOUNTER — Emergency Department: Payer: Self-pay

## 2019-09-25 ENCOUNTER — Encounter: Payer: Self-pay | Admitting: Emergency Medicine

## 2019-09-25 ENCOUNTER — Other Ambulatory Visit: Payer: Self-pay

## 2019-09-25 DIAGNOSIS — I10 Essential (primary) hypertension: Secondary | ICD-10-CM | POA: Insufficient documentation

## 2019-09-25 DIAGNOSIS — M5440 Lumbago with sciatica, unspecified side: Secondary | ICD-10-CM

## 2019-09-25 DIAGNOSIS — M5442 Lumbago with sciatica, left side: Secondary | ICD-10-CM | POA: Insufficient documentation

## 2019-09-25 LAB — POCT PREGNANCY, URINE: Preg Test, Ur: NEGATIVE

## 2019-09-25 MED ORDER — TRAMADOL HCL 50 MG PO TABS: 50.0000 mg | ORAL_TABLET | Freq: Four times a day (QID) | ORAL | 0 refills | Status: AC | PRN

## 2019-09-25 MED ORDER — HYDROMORPHONE HCL 1 MG/ML IJ SOLN
1.0000 mg | Freq: Once | INTRAMUSCULAR | Status: AC
  Administered 2019-09-25: 1 mg via INTRAMUSCULAR
  Filled 2019-09-25: qty 1

## 2019-09-25 MED ORDER — METHYLPREDNISOLONE SODIUM SUCC 125 MG IJ SOLR
125.0000 mg | Freq: Once | INTRAMUSCULAR | Status: AC
  Administered 2019-09-25: 18:00:00 125 mg via INTRAMUSCULAR
  Filled 2019-09-25: qty 2

## 2019-09-25 MED ORDER — METHYLPREDNISOLONE 4 MG PO TBPK: ORAL_TABLET | ORAL | 0 refills | Status: AC

## 2019-09-25 MED ORDER — CYCLOBENZAPRINE HCL 10 MG PO TABS: 10.0000 mg | ORAL_TABLET | Freq: Three times a day (TID) | ORAL | 0 refills | Status: AC | PRN

## 2019-09-25 NOTE — ED Provider Notes (Signed)
[facility] Emergency Department Provider Note   ____________________________________________   First MD Initiated Contact with Patient 09/25/19 1657     (approximate)  I have reviewed the triage vital signs and the nursing notes.   HISTORY  Chief Complaint Hip Pain and Leg Pain    HPI Beth Robertson is a 29 y.o. female patient complain of radicular back pain to the right lower extremity.  Patient state onset of pain after lifting heavy box and sneezing.  Patient states she felt a pop in her back and since then she has had pain with walking and sitting.  Patient denies bladder or bowel dysfunction.  Patient rates the pain as a 10/10.  Patient described pain as "sharp/aching".  No palliative measure for complaint.         Past Medical History:  Diagnosis Date  . Hypertension    with preganancy    Patient Active Problem List   Diagnosis Date Noted  . Cholecystitis, acute 03/14/2015    Past Surgical History:  Procedure Laterality Date  . CESAREAN SECTION    . CESAREAN SECTION  2010  . ROBOTIC ASSISTED LAPAROSCOPIC CHOLECYSTECTOMY-MULTI SITE N/A 02/10/2015   Procedure: ROBOTIC ASSISTED LAPAROSCOPIC CHOLECYSTECTOMY-SINGLE SITE ;  Surgeon: Natale Lay, MD;  Location: [facility] ORS;  Service: General;  Laterality: N/A;    Prior to Admission medications   Medication Sig Start Date End Date Taking? Authorizing Provider  cyclobenzaprine (FLEXERIL) 10 MG tablet Take 1 tablet (10 mg total) by mouth 3 (three) times daily as needed. 09/25/19   Joni Reining, PA-C  methylPREDNISolone (MEDROL DOSEPAK) 4 MG TBPK tablet Take Tapered dose as directed 09/25/19   Joni Reining, PA-C  traMADol (ULTRAM) 50 MG tablet Take 1 tablet (50 mg total) by mouth every 6 (six) hours as needed for moderate pain. 09/25/19   Joni Reining, PA-C    Allergies Oxycodone-acetaminophen and Percocet [oxycodone-acetaminophen]  Family History  Problem Relation Age of Onset  .  Cholelithiasis Maternal Grandmother     Social History Social History   Tobacco Use  . Smoking status: Never Smoker  . Smokeless tobacco: Never Used  Substance Use Topics  . Alcohol use: Yes    Comment: occasionally  . Drug use: No    Review of Systems Constitutional: No fever/chills Eyes: No visual changes. ENT: No sore throat. Cardiovascular: Denies chest pain. Respiratory: Denies shortness of breath. Gastrointestinal: No abdominal pain.  No nausea, no vomiting.  No diarrhea.  No constipation. Genitourinary: Negative for dysuria. Musculoskeletal: Back pain Skin: Negative for rash. Neurological: Negative for headaches, focal weakness or numbness. Allergic/Immunilogical: Percocet ____________________________________________   PHYSICAL EXAM:  VITAL SIGNS: ED Triage Vitals  Enc Vitals Group     BP 09/25/19 1502 118/76     Pulse Rate 09/25/19 1502 70     Resp 09/25/19 1502 20     Temp --      Temp Source 09/25/19 1502 Oral     SpO2 09/25/19 1502 97 %     Weight 09/25/19 1501 185 lb (83.9 kg)     Height 09/25/19 1501 5\' 1"  (1.549 m)     Head Circumference --      Peak Flow --      Pain Score 09/25/19 1500 10     Pain Loc --      Pain Edu? --      Excl. in GC? --     Constitutional: Alert and oriented.  Moderate distress.   Eyes: Conjunctivae  are normal. PERRL. EOMI. Cardiovascular: Normal rate, regular rhythm. Grossly normal heart sounds.  Good peripheral circulation. Respiratory: Normal respiratory effort.  No retractions. Lungs CTAB. Gastrointestinal: Soft and nontender. No distention. No abdominal bruits. No CVA tenderness. Genitourinary: Deferred Musculoskeletal: No lower extremity tenderness nor edema.  No joint effusions.  Positive straight leg test in supine position. Neurologic:  Normal speech and language. No gross focal neurologic deficits are appreciated. No gait instability. Skin:  Skin is warm, dry and intact. No rash noted. Psychiatric: Mood and  affect are normal. Speech and behavior are normal.  ____________________________________________   LABS (all labs ordered are listed, but only abnormal results are displayed)  Labs Reviewed  POC URINE PREG, ED  POCT PREGNANCY, URINE   ____________________________________________  EKG   ____________________________________________  RADIOLOGY  ED MD interpretation:    Official radiology report(s): DG Lumbar Spine Complete  Result Date: 09/25/2019 CLINICAL DATA:  29 year old female with acute back pain after lifting. EXAM: LUMBAR SPINE - COMPLETE 4+ VIEW COMPARISON:  None. FINDINGS: Five lumbar type vertebra. There is no acute fracture or subluxation of the lumbar spine. The vertebral body heights and disc spaces are maintained. The visualized posterior elements are intact. The neural foramina appear patent. An intrauterine device is noted. The soft tissues are unremarkable. IMPRESSION: No acute/traumatic lumbar spine pathology. Electronically Signed   By: Anner Crete M.D.   On: 09/25/2019 19:24    ____________________________________________   PROCEDURES  Procedure(s) performed (including Critical Care):  Procedures   ____________________________________________   INITIAL IMPRESSION / ASSESSMENT AND PLAN / ED COURSE  As part of my medical decision making, I reviewed the following data within the [place]     Patient presents with back pain with radicular component to right lower extremity.  Discussed negative findings with patient.  Patient feels exam is consistent with lumbar strain and sciatica.  Patient given discharge care instructions and advised take medication as directed.  Patient advised follow-up PCP if no improvement in 3 days.   Beth Robertson was evaluated in Emergency Department on 09/25/2019 for the symptoms described in the history of present illness. She was evaluated in the context of the global COVID-19 pandemic, which  necessitated consideration that the patient might be at risk for infection with the SARS-CoV-2 virus that causes COVID-19. Institutional protocols and algorithms that pertain to the evaluation of patients at risk for COVID-19 are in a state of rapid change based on information released by regulatory bodies including the CDC and federal and state organizations. These policies and algorithms were followed during the patient's care in the ED.       ____________________________________________   FINAL CLINICAL IMPRESSION(S) / ED DIAGNOSES  Final diagnoses:  Acute right-sided low back pain with sciatica, sciatica laterality unspecified     ED Discharge Orders         Ordered    traMADol (ULTRAM) 50 MG tablet  Every 6 hours PRN     09/25/19 1933    methylPREDNISolone (MEDROL DOSEPAK) 4 MG TBPK tablet     09/25/19 1933    cyclobenzaprine (FLEXERIL) 10 MG tablet  3 times daily PRN     09/25/19 1933           Note:  This document was prepared using Dragon voice recognition software and may include unintentional dictation errors.    Sable Feil, PA-C 09/25/19 1937    Lilia Pro., MD 09/25/19 2013

## 2019-09-25 NOTE — ED Notes (Signed)
Pt states she thinks she hurt her right lower back 2 days ago, sneezed hard this am and felt something "pop" and pain increased. Denies swelling or bruising.

## 2019-09-25 NOTE — ED Triage Notes (Signed)
Pt arrived via POV with reports of right buttock pain radiating down right leg since yesterday, painful when walking and sitting.  Pt took Tylenol for pain.

## 2019-09-25 NOTE — ED Notes (Signed)
Pt with emesis x1 after wheelchair to bathroom. Pt states she feels less nauseous since vomiting.

## 2021-04-03 IMAGING — CR DG LUMBAR SPINE COMPLETE 4+V
1 series · 5 of 5 positions shown · non-contrast
Comparison: None.

CLINICAL DATA: 28-year-old female with acute back pain after
lifting.

EXAM:
LUMBAR SPINE - COMPLETE 4+ VIEW

[Series 1: dg lumbar spine complete 4 +v · 0.14mm/px · 5 of 5 slices shown]
[im 1/5]
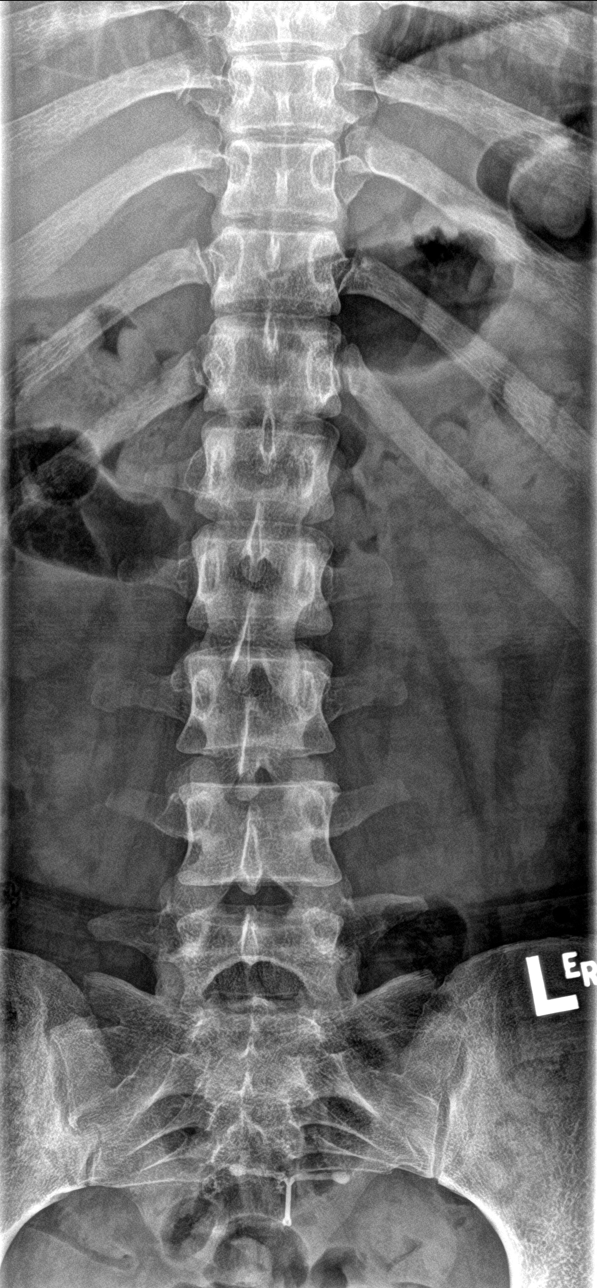
[im 2/5]
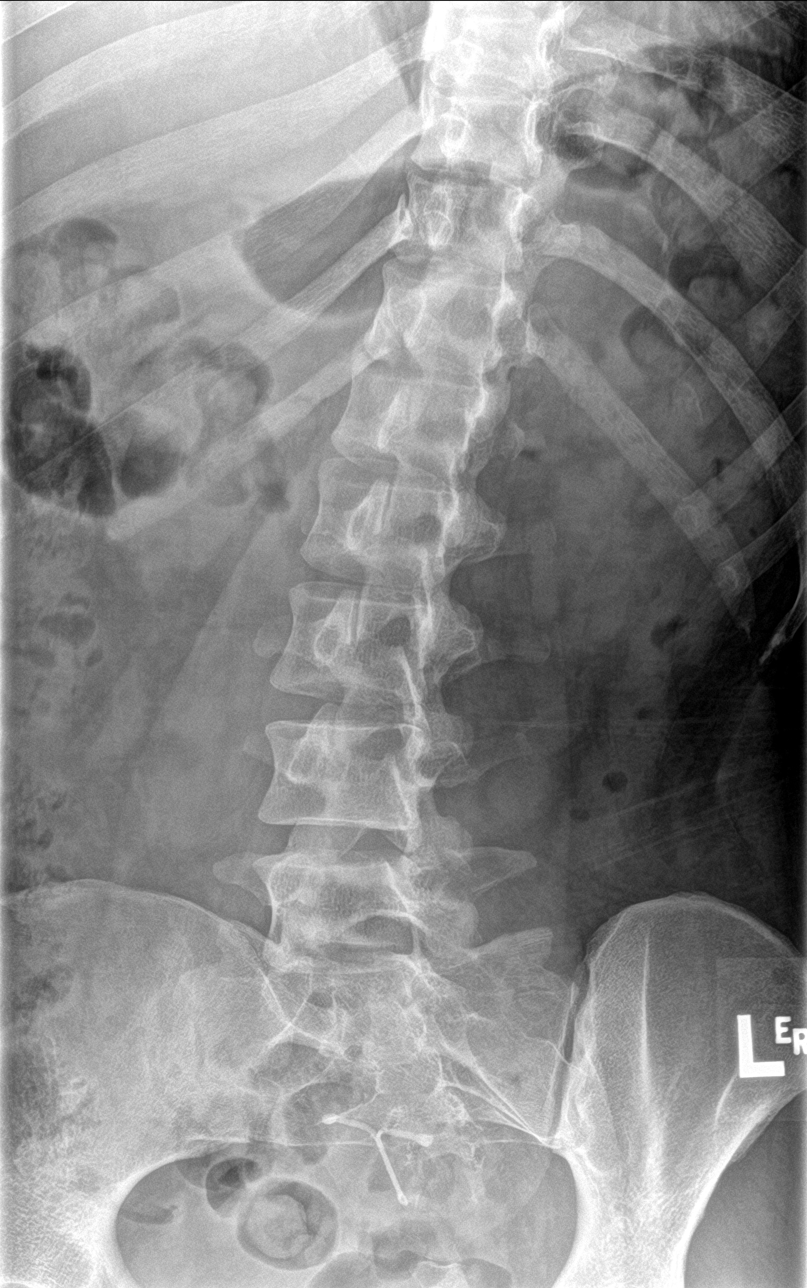
[im 3/5]
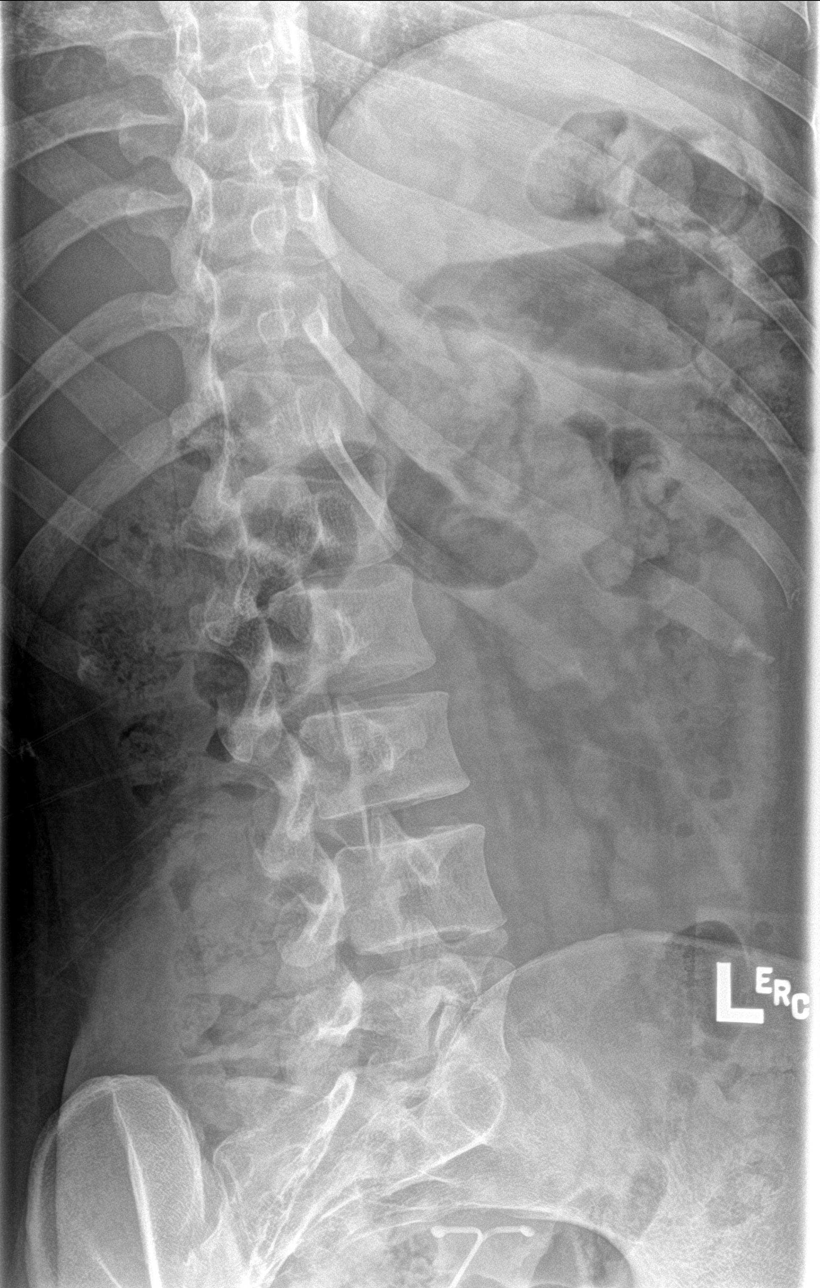
[im 4/5]
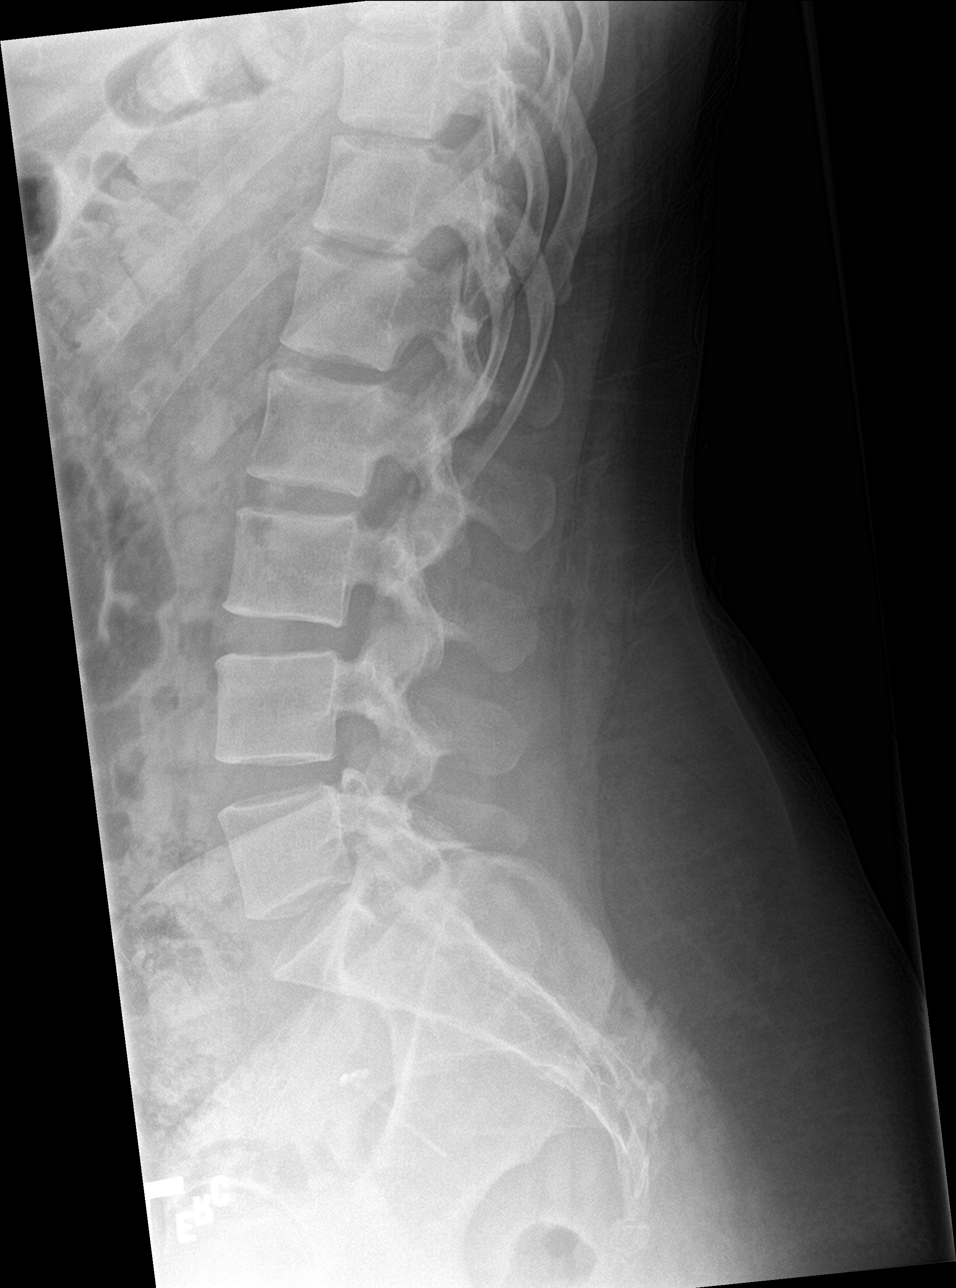
[im 5/5]
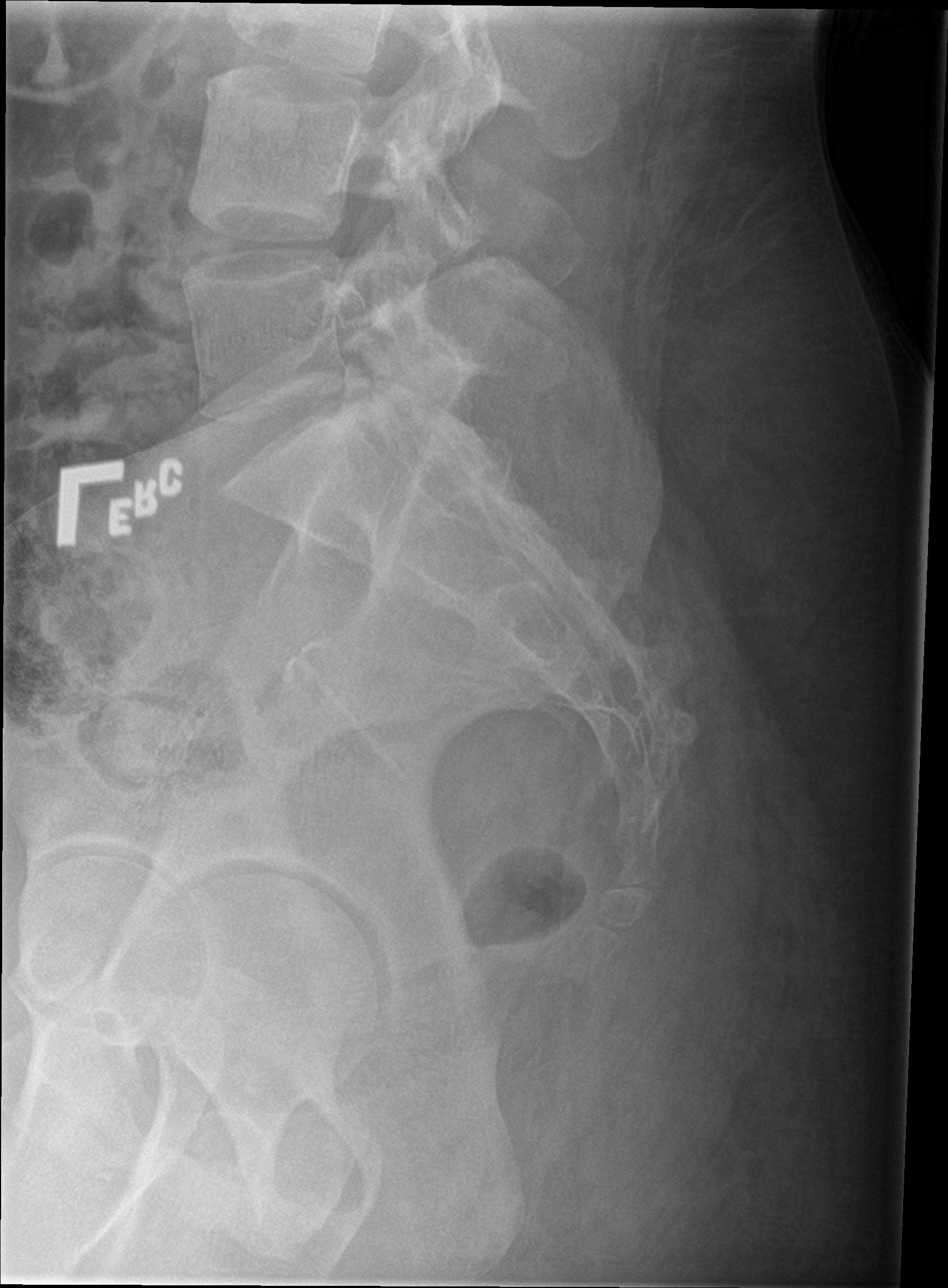

[5 of 5 positions shown; findings below may reference images not displayed]

FINDINGS: Five lumbar type vertebra. There is no acute fracture or subluxation
of the lumbar spine. The vertebral body heights and disc spaces are
maintained. The visualized posterior elements are intact. The neural
foramina appear patent. An intrauterine device is noted. The soft
tissues are unremarkable.
IMPRESSION: No acute/traumatic lumbar spine pathology.
